# Patient Record
Sex: Female | Born: 1990 | Race: White | Hispanic: No | Marital: Single
Health system: Southern US, Community
[De-identification: ages and names within clinical notes are randomized; demographics above are authoritative.]

## PROBLEM LIST (undated history)

## (undated) DIAGNOSIS — J45909 Unspecified asthma, uncomplicated: Secondary | ICD-10-CM

## (undated) HISTORY — PX: INDUCED ABORTION: SHX677

---

## 2008-04-19 ENCOUNTER — Emergency Department (HOSPITAL_COMMUNITY): Admission: EM | Admit: 2008-04-19 | Discharge: 2008-04-20 | Payer: Self-pay | Admitting: Emergency Medicine

## 2013-02-28 ENCOUNTER — Emergency Department (HOSPITAL_COMMUNITY)
Admission: EM | Admit: 2013-02-28 | Discharge: 2013-03-02 | Disposition: A | Discharge status: Other Acute Inpatient Hospital | Payer: Self-pay | Attending: Emergency Medicine | Admitting: Emergency Medicine

## 2013-02-28 ENCOUNTER — Encounter (HOSPITAL_COMMUNITY): Payer: Self-pay | Admitting: Emergency Medicine

## 2013-02-28 DIAGNOSIS — R45851 Suicidal ideations: Secondary | ICD-10-CM | POA: Insufficient documentation

## 2013-02-28 DIAGNOSIS — J45909 Unspecified asthma, uncomplicated: Secondary | ICD-10-CM | POA: Insufficient documentation

## 2013-02-28 DIAGNOSIS — F1120 Opioid dependence, uncomplicated: Secondary | ICD-10-CM | POA: Insufficient documentation

## 2013-02-28 DIAGNOSIS — F1910 Other psychoactive substance abuse, uncomplicated: Secondary | ICD-10-CM | POA: Insufficient documentation

## 2013-02-28 HISTORY — DX: Unspecified asthma, uncomplicated: J45.909

## 2013-02-28 LAB — RAPID URINE DRUG SCREEN, HOSP PERFORMED
Amphetamines: NOT DETECTED
Benzodiazepines: POSITIVE — AB
Cocaine: POSITIVE — AB

## 2013-02-28 LAB — CBC WITH DIFFERENTIAL/PLATELET
Basophils Relative: 4 % — ABNORMAL HIGH (ref 0–1)
Eosinophils Absolute: 0.1 10*3/uL (ref 0.0–0.7)
HCT: 38 % (ref 36.0–46.0)
Hemoglobin: 12.5 g/dL (ref 12.0–15.0)
Lymphs Abs: 2.9 10*3/uL (ref 0.7–4.0)
MCH: 28.8 pg (ref 26.0–34.0)
MCHC: 32.9 g/dL (ref 30.0–36.0)
MCV: 87.6 fL (ref 78.0–100.0)
Monocytes Absolute: 0.6 10*3/uL (ref 0.1–1.0)
Neutro Abs: 1.9 10*3/uL (ref 1.7–7.7)

## 2013-02-28 LAB — COMPREHENSIVE METABOLIC PANEL
Alkaline Phosphatase: 78 U/L (ref 39–117)
BUN: 9 mg/dL (ref 6–23)
Calcium: 9.1 mg/dL (ref 8.4–10.5)
Creatinine, Ser: 0.9 mg/dL (ref 0.50–1.10)
GFR calc Af Amer: >90 mL/min (ref >90)
Glucose, Bld: 96 mg/dL (ref 70–99)
Potassium: 4.1 mEq/L (ref 3.5–5.1)
Total Protein: 6.9 g/dL (ref 6.0–8.3)

## 2013-02-28 LAB — URINALYSIS, ROUTINE W REFLEX MICROSCOPIC
Glucose, UA: NEGATIVE mg/dL
Leukocytes, UA: NEGATIVE
Nitrite: NEGATIVE
Specific Gravity, Urine: 1.033 — ABNORMAL HIGH (ref 1.005–1.030)
pH: 5.5 (ref 5.0–8.0)

## 2013-02-28 LAB — GLUCOSE, CAPILLARY: Glucose-Capillary: 109 mg/dL — ABNORMAL HIGH (ref 70–99)

## 2013-02-28 LAB — ACETAMINOPHEN LEVEL: Acetaminophen (Tylenol), Serum: <15 ug/mL (ref 10–30)

## 2013-02-28 MED ORDER — ONDANSETRON HCL 4 MG PO TABS
4.0000 mg | ORAL_TABLET | Freq: Three times a day (TID) | ORAL | Status: DC | PRN
  Administered 2013-03-01 (×3): 4 mg via ORAL
  Filled 2013-02-28 (×3): qty 1

## 2013-02-28 MED ORDER — LORAZEPAM 1 MG PO TABS
1.0000 mg | ORAL_TABLET | Freq: Three times a day (TID) | ORAL | Status: DC | PRN
  Administered 2013-03-01: 1 mg via ORAL
  Filled 2013-02-28: qty 2

## 2013-02-28 MED ORDER — NICOTINE 21 MG/24HR TD PT24
21.0000 mg | MEDICATED_PATCH | Freq: Every day | TRANSDERMAL | Status: DC
  Administered 2013-02-28 – 2013-03-01 (×2): 21 mg via TRANSDERMAL
  Filled 2013-02-28 (×2): qty 1

## 2013-02-28 MED ORDER — ACETAMINOPHEN 325 MG PO TABS
650.0000 mg | ORAL_TABLET | ORAL | Status: DC | PRN
  Administered 2013-03-01: 650 mg via ORAL
  Filled 2013-02-28: qty 2

## 2013-02-28 MED ORDER — ALUM & MAG HYDROXIDE-SIMETH 200-200-20 MG/5ML PO SUSP
30.0000 mL | ORAL | Status: DC | PRN
  Administered 2013-03-01: 30 mL via ORAL
  Filled 2013-02-28: qty 30

## 2013-02-28 MED ORDER — IBUPROFEN 200 MG PO TABS
600.0000 mg | ORAL_TABLET | Freq: Three times a day (TID) | ORAL | Status: DC | PRN
  Administered 2013-02-28 – 2013-03-01 (×3): 600 mg via ORAL
  Filled 2013-02-28: qty 2
  Filled 2013-02-28: qty 3
  Filled 2013-02-28: qty 1
  Filled 2013-02-28: qty 3

## 2013-02-28 MED ORDER — ZOLPIDEM TARTRATE 5 MG PO TABS
5.0000 mg | ORAL_TABLET | Freq: Every evening | ORAL | Status: DC | PRN
  Administered 2013-03-01 (×2): 5 mg via ORAL
  Filled 2013-02-28 (×2): qty 1
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
## 2013-02-28 NOTE — ED Notes (Signed)
Pt wanded by security. 

## 2013-02-28 NOTE — ED Provider Notes (Signed)
History    This chart was scribed for non-physician practitioner, Junious Silk PA-C, working with Laray Anger, DO by Donne Anon, ED Scribe. This patient was seen in room TR11C/TR11C and the patient's care was started at 1651.   CSN: [id number]  Arrival date & time 02/28/13  1554   First MD Initiated Contact with Patient 02/28/13 1651      Chief Complaint  Patient presents with  . Addiction Problem     The history is provided by the patient. No language interpreter was used.   HPI Comments: Kiara Stanley is a 22 y.o. female who presents to the Emergency Department desiring to detox from heroin, xanax and opiates. She reports she has been struggling with addiction for 7 years. She has tried to detox before with the last time being last week on her own. She has tried to detox as an outpatient once before but was unsuccesful. She reports she was in a facility in [place] years ago but she has since relapsed. She last used heroin this morning. She has attempted suicide once before by taking Seroquel and she reports she has thoughts of SI if she does not stop using opiates. She denies  HI or alcohol use. She denies difficulty breathing, fever, chills, nausea, vomiting or any other pain.    Past Medical History  Diagnosis Date  . Asthma     Past Surgical History  Procedure Laterality Date  . Induced abortion      No family history on file.  History  Substance Use Topics  . Smoking status: Current Every Day Smoker  . Smokeless tobacco: Not on file  . Alcohol Use: Not on file     Review of Systems  Constitutional: Negative for fever and chills.  Respiratory: Negative for shortness of breath.   Gastrointestinal: Negative for nausea and vomiting.  Psychiatric/Behavioral: Positive for suicidal ideas.  All other systems reviewed and are negative.    Allergies  Review of patient's allergies indicates no known allergies.  Home Medications  No current outpatient  prescriptions on file.  Triage Vitals; BP 105/76  Pulse 87  Temp(Src) 98.3 F (36.8 C) (Oral)  Resp 16  Ht 5\' 4"  (1.626 m)  Wt 125 lb (56.7 kg)  BMI 21.45 kg/m2  SpO2 96%  LMP 01/28/2013  Physical Exam  Nursing note and vitals reviewed. Constitutional: She is oriented to person, place, and time. She appears well-developed and well-nourished. No distress.  HENT:  Head: Normocephalic and atraumatic.  Right Ear: External ear normal.  Left Ear: External ear normal.  Nose: Nose normal.  Mouth/Throat: Oropharynx is clear and moist.  Eyes: Conjunctivae are normal. Right eye exhibits no discharge. Left eye exhibits no discharge. No scleral icterus.  Neck: Normal range of motion. Neck supple.  Cardiovascular: Normal rate, regular rhythm and normal heart sounds.   Pulmonary/Chest: Effort normal and breath sounds normal. No stridor. No respiratory distress. She has no wheezes. She has no rales.  Abdominal: Soft. She exhibits no distension.  Musculoskeletal: Normal range of motion.  Neurological: She is alert and oriented to person, place, and time. She has normal strength. Cranial nerve deficit:  no gross defecits noted.  Skin: Skin is warm and dry. She is not diaphoretic. No erythema.  Psychiatric: Her speech is slurred.    ED Course  Procedures (including critical care time) DIAGNOSTIC STUDIES: Oxygen Saturation is 96% on RA, adequate by my interpretation.    COORDINATION OF CARE: 6:14 PM Discussed treatment plan which  includes Telepsych with pt at bedside and pt agreed to plan.     Labs Reviewed  URINALYSIS, ROUTINE W REFLEX MICROSCOPIC  URINE RAPID DRUG SCREEN (HOSP PERFORMED)  ETHANOL  CBC WITH DIFFERENTIAL  COMPREHENSIVE METABOLIC PANEL  ACETAMINOPHEN LEVEL  SALICYLATE LEVEL  GLUCOSE, CAPILLARY  POCT PREGNANCY, URINE   No results found.   1. Opiate dependence   2. Suicidal ideation   3. Polysubstance abuse       MDM  Patient presents with wishes to detox  from heroin and xanax and suicidal ideations. Vital signs stable for transfer to [facility]. ACT team and telepsych consulted. Discussed case with Dr. Clarene Duke who agrees with plan.    I personally performed the services described in this documentation, which was scribed in my presence. The recorded information has been reviewed and is accurate.        Mora Bellman, PA-C 03/08/13 [phone]

## 2013-02-28 NOTE — ED Notes (Signed)
Pt st's she needs Detox from Heroin,  Xanax and opiates.   St's last used heroin this am.  Pt st's she has been trying to call places to get help but can't find anywhere to go.  Pt very tearful. St's she does not want to die.

## 2013-03-01 MED ORDER — DICYCLOMINE HCL 20 MG PO TABS
20.0000 mg | ORAL_TABLET | Freq: Four times a day (QID) | ORAL | Status: DC | PRN
  Administered 2013-03-01 (×3): 20 mg via ORAL
  Filled 2013-03-01 (×3): qty 1

## 2013-03-01 MED ORDER — NAPROXEN 250 MG PO TABS
500.0000 mg | ORAL_TABLET | Freq: Two times a day (BID) | ORAL | Status: DC | PRN
  Administered 2013-03-01: 500 mg via ORAL
  Filled 2013-03-01: qty 2

## 2013-03-01 MED ORDER — CLONIDINE HCL 0.1 MG PO TABS: 0.1000 mg | ORAL_TABLET | ORAL | Status: DC

## 2013-03-01 MED ORDER — METHOCARBAMOL 500 MG PO TABS
500.0000 mg | ORAL_TABLET | Freq: Three times a day (TID) | ORAL | Status: DC | PRN
  Administered 2013-03-01 (×3): 500 mg via ORAL
  Filled 2013-03-01 (×3): qty 1

## 2013-03-01 MED ORDER — ONDANSETRON 4 MG PO TBDP
4.0000 mg | ORAL_TABLET | Freq: Four times a day (QID) | ORAL | Status: DC | PRN
  Filled 2013-03-01: qty 1

## 2013-03-01 MED ORDER — LOPERAMIDE HCL 2 MG PO CAPS
2.0000 mg | ORAL_CAPSULE | ORAL | Status: DC | PRN
  Administered 2013-03-01: 2 mg via ORAL
  Filled 2013-03-01: qty 2

## 2013-03-01 MED ORDER — HYDROXYZINE HCL 25 MG PO TABS
25.0000 mg | ORAL_TABLET | Freq: Four times a day (QID) | ORAL | Status: DC | PRN
  Administered 2013-03-01 (×2): 25 mg via ORAL
  Filled 2013-03-01 (×2): qty 1

## 2013-03-01 MED ORDER — LORAZEPAM 1 MG PO TABS
2.0000 mg | ORAL_TABLET | ORAL | Status: DC | PRN
  Administered 2013-03-01: 2 mg via ORAL
  Administered 2013-03-01: 1 mg via ORAL
  Filled 2013-03-01: qty 2
  Filled 2013-03-01: qty 1

## 2013-03-01 MED ORDER — CLONIDINE HCL 0.1 MG PO TABS
0.1000 mg | ORAL_TABLET | Freq: Four times a day (QID) | ORAL | Status: DC
  Administered 2013-03-01 – 2013-03-02 (×4): 0.1 mg via ORAL
  Filled 2013-03-01 (×4): qty 1

## 2013-03-01 MED ORDER — CLONIDINE HCL 0.1 MG PO TABS: 0.1000 mg | ORAL_TABLET | Freq: Every day | ORAL | Status: DC

## 2013-03-01 MED ORDER — MORPHINE SULFATE ER 30 MG PO TBCR
60.0000 mg | EXTENDED_RELEASE_TABLET | Freq: Two times a day (BID) | ORAL | Status: DC
  Administered 2013-03-01: 60 mg via ORAL
  Filled 2013-03-01: qty 2

## 2013-03-01 MED ORDER — LORAZEPAM 1 MG PO TABS
1.0000 mg | ORAL_TABLET | Freq: Four times a day (QID) | ORAL | Status: DC | PRN
  Administered 2013-03-01 (×2): 1 mg via ORAL
  Filled 2013-03-01 (×2): qty 1

## 2013-03-01 NOTE — ED Provider Notes (Signed)
Pt resting comfortably, nad. Act eval/disposition remain pending.   Suzi Roots, MD 03/01/13 [phone]

## 2013-03-01 NOTE — ED Notes (Signed)
Patient's belongings inventoried and secured in ED storage. Patient also has jewelry and cash locked in ED security, envelope [id number].

## 2013-03-01 NOTE — ED Notes (Signed)
Patient has ambulated to the restroom. Gait steady. Linens changed on bed

## 2013-03-01 NOTE — BH Assessment (Signed)
[facility] Assessment Progress Note      Update:  [employer] and per [employer] [facility] [employer], beds available @ 1423.  Referral faxed for review.  Will continue bed finding for pt.

## 2013-03-01 NOTE — ED Notes (Signed)
Pt ambulated to BR without difficulty, given new set of scrubs to change into, calm and cooperative
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
## 2013-03-01 NOTE — BH Assessment (Signed)
[facility] Assessment Progress Note      Received a call from [place] at [facility]. Ms Halbleib has been accepted to their waiting list, but there are currently no appropriate beds available.  She will be able to come in the morning and Marcelino Duster will call back this evening with bed assignment.

## 2013-03-01 NOTE — ED Provider Notes (Signed)
Seen by Telepsych, Pt without SI/HI/psychosis, OK for detox programs, calm and cooperative now. 0700  Hurman Horn, MD 03/01/13 [phone]

## 2013-03-01 NOTE — ED Notes (Signed)
Patient states she uses 500-600 dollars of heroin a day. States started using opiates at age 22. States last detox was in 2012. Was only clean 1 day when got out of detox. States she wants off all drugs. Pt agreeable that the ms contin needs to be discontinued here if she wants to enter a detox facility. Will provide a safe quiet place for pt to begin her detox.

## 2013-03-01 NOTE — ED Notes (Signed)
Pt interviewed by tele psych doctor.
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
## 2013-03-01 NOTE — ED Provider Notes (Signed)
Medical screening examination/treatment/procedure(s) were conducted as a shared visit with non-physician practitioner(s) and myself.  I personally evaluated the patient during the encounter  21yo F, c/o heroin, opiate and xanax abuse for the past 7 years. LD heroin this morning. Endorses vague SI. No SA or HI. Requesting detox. A&O, resps easy, neuro non-focal. Labs and UDS obtained. ACT to see.    Laray Anger, DO 03/01/13 [phone]

## 2013-03-02 NOTE — ED Notes (Signed)
PT HAS ARRIVED TO TRANSPORT PT TO [facility]

## 2013-03-02 NOTE — BH Assessment (Signed)
[facility] Assessment Progress Note      Pt accepted to [facility] building by Dr Les Pou.  May be transported after 0700.  Unit secretary called [facility] to see if they would transfer.  Awaiting return call.

## 2013-03-02 NOTE — ED Notes (Signed)
PT HAS LEFT FOR [facility]. REPORT CALLED TO [facility]

## 2013-03-02 NOTE — ED Provider Notes (Signed)
BP 110/80  Pulse 48  Temp(Src) 97.4 F (36.3 C) (Oral)  Resp 17  Ht 5\' 4"  (1.626 m)  Wt 125 lb (56.7 kg)  BMI 21.45 kg/m2  SpO2 97%  LMP 01/28/2013 No acute issues overnight. Resting comfortably. Pending placement.   Loren Racer, MD 03/02/13 [phone]

## 2013-03-10 NOTE — ED Provider Notes (Signed)
Medical screening examination/treatment/procedure(s) were conducted as a shared visit with non-physician practitioner(s) and myself.  I personally evaluated the patient during the encounter. Please see my previous note.  Laray Anger, DO 03/10/13 [phone]

## 2013-03-26 ENCOUNTER — Emergency Department (HOSPITAL_COMMUNITY)
Admission: EM | Admit: 2013-03-26 | Discharge: 2013-03-26 | Disposition: A | Discharge status: Home / Self Care | Payer: Self-pay | Attending: Emergency Medicine | Admitting: Emergency Medicine

## 2013-03-26 ENCOUNTER — Emergency Department (HOSPITAL_COMMUNITY): Payer: Self-pay

## 2013-03-26 ENCOUNTER — Encounter (HOSPITAL_COMMUNITY): Payer: Self-pay | Admitting: Emergency Medicine

## 2013-03-26 DIAGNOSIS — IMO0002 Reserved for concepts with insufficient information to code with codable children: Secondary | ICD-10-CM | POA: Insufficient documentation

## 2013-03-26 DIAGNOSIS — Y939 Activity, unspecified: Secondary | ICD-10-CM | POA: Insufficient documentation

## 2013-03-26 DIAGNOSIS — F17200 Nicotine dependence, unspecified, uncomplicated: Secondary | ICD-10-CM | POA: Insufficient documentation

## 2013-03-26 DIAGNOSIS — Y92410 Unspecified street and highway as the place of occurrence of the external cause: Secondary | ICD-10-CM | POA: Insufficient documentation

## 2013-03-26 DIAGNOSIS — S50811A Abrasion of right forearm, initial encounter: Secondary | ICD-10-CM

## 2013-03-26 DIAGNOSIS — J45909 Unspecified asthma, uncomplicated: Secondary | ICD-10-CM | POA: Insufficient documentation

## 2013-03-26 DIAGNOSIS — Z791 Long term (current) use of non-steroidal anti-inflammatories (NSAID): Secondary | ICD-10-CM | POA: Insufficient documentation

## 2013-03-26 LAB — POCT I-STAT, CHEM 8
Creatinine, Ser: 0.9 mg/dL (ref 0.50–1.10)
Glucose, Bld: 90 mg/dL (ref 70–99)
Hemoglobin: 12.6 g/dL (ref 12.0–15.0)
Sodium: 137 mEq/L (ref 135–145)
TCO2: 31 mmol/L (ref 0–100)

## 2013-03-26 LAB — ETHANOL: Alcohol, Ethyl (B): <11 mg/dL (ref 0–11)

## 2013-03-26 MED ORDER — NAPROXEN 375 MG PO TABS: 375.0000 mg | ORAL_TABLET | Freq: Two times a day (BID) | ORAL | Status: AC

## 2013-03-26 NOTE — ED Notes (Signed)
Mother on the way to pick up pt.
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
## 2013-03-26 NOTE — ED Notes (Signed)
April Lacap (mother) [phone]
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
## 2013-03-26 NOTE — ED Notes (Signed)
Pt not responding, garbled words, drifts in and out of sleep. Pt placed on 2L of O2 sats at 87%. Currently 100%.
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
## 2014-03-01 ENCOUNTER — Ambulatory Visit: Payer: Self-pay | Admitting: Physician Assistant

## 2014-03-01 DIAGNOSIS — Z0289 Encounter for other administrative examinations: Secondary | ICD-10-CM

## 2014-03-11 ENCOUNTER — Telehealth: Payer: Self-pay | Admitting: Physician Assistant

## 2014-03-11 ENCOUNTER — Ambulatory Visit: Payer: Self-pay | Admitting: Physician Assistant

## 2014-03-11 DIAGNOSIS — Z0289 Encounter for other administrative examinations: Secondary | ICD-10-CM

## 2014-03-11 NOTE — Telephone Encounter (Signed)
Tried to contact patient, voicemail was full.  Per, Kiara Stanley, patient has no showed and cancelled several times. Patient cannot schedule [facility] at [facility].

## 2015-06-12 ENCOUNTER — Inpatient Hospital Stay (HOSPITAL_COMMUNITY)
Admission: EM | Admit: 2015-06-12 | Discharge: 2015-06-28 | DRG: 781 | Disposition: E | Payer: Medicaid Other | Attending: Emergency Medicine | Admitting: Emergency Medicine

## 2015-06-12 ENCOUNTER — Inpatient Hospital Stay (HOSPITAL_COMMUNITY): Payer: Medicaid Other

## 2015-06-12 ENCOUNTER — Emergency Department (HOSPITAL_COMMUNITY): Payer: Medicaid Other

## 2015-06-12 ENCOUNTER — Encounter (HOSPITAL_COMMUNITY): Payer: Self-pay | Admitting: Neurology

## 2015-06-12 DIAGNOSIS — Z3A08 8 weeks gestation of pregnancy: Secondary | ICD-10-CM | POA: Diagnosis present

## 2015-06-12 DIAGNOSIS — Z66 Do not resuscitate: Secondary | ICD-10-CM | POA: Diagnosis not present

## 2015-06-12 DIAGNOSIS — G931 Anoxic brain damage, not elsewhere classified: Secondary | ICD-10-CM | POA: Diagnosis present

## 2015-06-12 DIAGNOSIS — G936 Cerebral edema: Secondary | ICD-10-CM | POA: Diagnosis present

## 2015-06-12 DIAGNOSIS — A419 Sepsis, unspecified organism: Secondary | ICD-10-CM | POA: Diagnosis present

## 2015-06-12 DIAGNOSIS — O99411 Diseases of the circulatory system complicating pregnancy, first trimester: Secondary | ICD-10-CM | POA: Diagnosis present

## 2015-06-12 DIAGNOSIS — G9340 Encephalopathy, unspecified: Secondary | ICD-10-CM | POA: Diagnosis present

## 2015-06-12 DIAGNOSIS — J690 Pneumonitis due to inhalation of food and vomit: Secondary | ICD-10-CM | POA: Diagnosis present

## 2015-06-12 DIAGNOSIS — R570 Cardiogenic shock: Secondary | ICD-10-CM | POA: Diagnosis present

## 2015-06-12 DIAGNOSIS — R4182 Altered mental status, unspecified: Secondary | ICD-10-CM

## 2015-06-12 DIAGNOSIS — R739 Hyperglycemia, unspecified: Secondary | ICD-10-CM | POA: Diagnosis present

## 2015-06-12 DIAGNOSIS — R6521 Severe sepsis with septic shock: Secondary | ICD-10-CM | POA: Diagnosis present

## 2015-06-12 DIAGNOSIS — T401X4A Poisoning by heroin, undetermined, initial encounter: Secondary | ICD-10-CM | POA: Diagnosis present

## 2015-06-12 DIAGNOSIS — G935 Compression of brain: Secondary | ICD-10-CM | POA: Diagnosis present

## 2015-06-12 DIAGNOSIS — O9A211 Injury, poisoning and certain other consequences of external causes complicating pregnancy, first trimester: Principal | ICD-10-CM | POA: Diagnosis present

## 2015-06-12 DIAGNOSIS — K7200 Acute and subacute hepatic failure without coma: Secondary | ICD-10-CM | POA: Diagnosis present

## 2015-06-12 DIAGNOSIS — O2690 Pregnancy related conditions, unspecified, unspecified trimester: Secondary | ICD-10-CM

## 2015-06-12 DIAGNOSIS — E875 Hyperkalemia: Secondary | ICD-10-CM | POA: Diagnosis present

## 2015-06-12 DIAGNOSIS — N179 Acute kidney failure, unspecified: Secondary | ICD-10-CM | POA: Diagnosis present

## 2015-06-12 DIAGNOSIS — I469 Cardiac arrest, cause unspecified: Secondary | ICD-10-CM | POA: Diagnosis present

## 2015-06-12 DIAGNOSIS — J9601 Acute respiratory failure with hypoxia: Secondary | ICD-10-CM | POA: Diagnosis present

## 2015-06-12 DIAGNOSIS — O98811 Other maternal infectious and parasitic diseases complicating pregnancy, first trimester: Secondary | ICD-10-CM | POA: Diagnosis present

## 2015-06-12 DIAGNOSIS — O99011 Anemia complicating pregnancy, first trimester: Secondary | ICD-10-CM | POA: Diagnosis present

## 2015-06-12 DIAGNOSIS — E872 Acidosis: Secondary | ICD-10-CM | POA: Diagnosis present

## 2015-06-12 DIAGNOSIS — Y92009 Unspecified place in unspecified non-institutional (private) residence as the place of occurrence of the external cause: Secondary | ICD-10-CM

## 2015-06-12 DIAGNOSIS — N170 Acute kidney failure with tubular necrosis: Secondary | ICD-10-CM | POA: Diagnosis present

## 2015-06-12 DIAGNOSIS — F1110 Opioid abuse, uncomplicated: Secondary | ICD-10-CM | POA: Diagnosis present

## 2015-06-12 LAB — RAPID URINE DRUG SCREEN, HOSP PERFORMED
Amphetamines: NOT DETECTED
BARBITURATES: NOT DETECTED
Benzodiazepines: POSITIVE — AB
COCAINE: NOT DETECTED
Opiates: POSITIVE — AB
Tetrahydrocannabinol: NOT DETECTED

## 2015-06-12 LAB — URINALYSIS, ROUTINE W REFLEX MICROSCOPIC
BILIRUBIN URINE: NEGATIVE
Glucose, UA: NEGATIVE mg/dL
HGB URINE DIPSTICK: NEGATIVE
KETONES UR: NEGATIVE mg/dL
Leukocytes, UA: NEGATIVE
Nitrite: NEGATIVE
PROTEIN: NEGATIVE mg/dL
Specific Gravity, Urine: 1.021 (ref 1.005–1.030)
UROBILINOGEN UA: 1 mg/dL (ref 0.0–1.0)
pH: 6 (ref 5.0–8.0)

## 2015-06-12 LAB — I-STAT CHEM 8, ED
BUN: 12 mg/dL (ref 6–20)
BUN: 11 mg/dL (ref 6–20)
CALCIUM ION: 1.11 mmol/L — AB (ref 1.12–1.23)
CHLORIDE: 105 mmol/L (ref 101–111)
CHLORIDE: 105 mmol/L (ref 101–111)
CREATININE: 1.1 mg/dL — AB (ref 0.44–1.00)
Calcium, Ion: 1.14 mmol/L (ref 1.12–1.23)
Creatinine, Ser: 1.2 mg/dL — ABNORMAL HIGH (ref 0.44–1.00)
GLUCOSE: 271 mg/dL — AB (ref 65–99)
Glucose, Bld: 228 mg/dL — ABNORMAL HIGH (ref 65–99)
HCT: 40 % (ref 36.0–46.0)
HEMATOCRIT: 35 % — AB (ref 36.0–46.0)
Hemoglobin: 13.6 g/dL (ref 12.0–15.0)
Hemoglobin: 11.9 g/dL — ABNORMAL LOW (ref 12.0–15.0)
POTASSIUM: 5.9 mmol/L — AB (ref 3.5–5.1)
POTASSIUM: 4.8 mmol/L (ref 3.5–5.1)
SODIUM: 138 mmol/L (ref 135–145)
Sodium: 138 mmol/L (ref 135–145)
TCO2: 14 mmol/L (ref 0–100)
TCO2: 15 mmol/L (ref 0–100)

## 2015-06-12 LAB — SALICYLATE LEVEL: SALICYLATE LVL: 5.1 mg/dL (ref 2.8–30.0)

## 2015-06-12 LAB — I-STAT TROPONIN, ED: Troponin i, poc: 0.19 ng/mL (ref 0.00–0.08)

## 2015-06-12 LAB — CBC WITH DIFFERENTIAL/PLATELET
BASOS ABS: 0 10*3/uL (ref 0.0–0.1)
Basophils Relative: 0 %
EOS ABS: 0.1 10*3/uL (ref 0.0–0.7)
Eosinophils Relative: 1 %
HCT: 33.2 % — ABNORMAL LOW (ref 36.0–46.0)
Hemoglobin: 10.3 g/dL — ABNORMAL LOW (ref 12.0–15.0)
LYMPHS PCT: 73 %
Lymphs Abs: 7.3 10*3/uL — ABNORMAL HIGH (ref 0.7–4.0)
MCH: 30.1 pg (ref 26.0–34.0)
MCHC: 31 g/dL (ref 30.0–36.0)
MCV: 97.1 fL (ref 78.0–100.0)
Monocytes Absolute: 0.4 10*3/uL (ref 0.1–1.0)
Monocytes Relative: 4 %
NEUTROS PCT: 22 %
Neutro Abs: 2.2 10*3/uL (ref 1.7–7.7)
PLATELETS: 125 10*3/uL — AB (ref 150–400)
RBC: 3.42 MIL/uL — ABNORMAL LOW (ref 3.87–5.11)
RDW: 13.6 % (ref 11.5–15.5)
WBC: 10 10*3/uL (ref 4.0–10.5)

## 2015-06-12 LAB — I-STAT ARTERIAL BLOOD GAS, ED
ACID-BASE DEFICIT: 22 mmol/L — AB (ref 0.0–2.0)
Bicarbonate: 9.9 mEq/L — ABNORMAL LOW (ref 20.0–24.0)
O2 SAT: 88 %
PCO2 ART: 46.6 mmHg — AB (ref 35.0–45.0)
PH ART: 6.936 — AB (ref 7.350–7.450)
Patient temperature: 98.6
TCO2: 11 mmol/L (ref 0–100)
pO2, Arterial: 87 mmHg (ref 80.0–100.0)

## 2015-06-12 LAB — COMPREHENSIVE METABOLIC PANEL
ALT: 793 U/L — AB (ref 14–54)
AST: 874 U/L — AB (ref 15–41)
Albumin: 2.1 g/dL — ABNORMAL LOW (ref 3.5–5.0)
Alkaline Phosphatase: 68 U/L (ref 38–126)
Anion gap: 22 — ABNORMAL HIGH (ref 5–15)
BUN: 7 mg/dL (ref 6–20)
CHLORIDE: 111 mmol/L (ref 101–111)
CO2: 9 mmol/L — AB (ref 22–32)
CREATININE: 1.18 mg/dL — AB (ref 0.44–1.00)
Calcium: 7.6 mg/dL — ABNORMAL LOW (ref 8.9–10.3)
GFR calc non Af Amer: >60 mL/min (ref >60)
Glucose, Bld: 241 mg/dL — ABNORMAL HIGH (ref 65–99)
Potassium: 3.9 mmol/L (ref 3.5–5.1)
SODIUM: 142 mmol/L (ref 135–145)
Total Bilirubin: 0.3 mg/dL (ref 0.3–1.2)
Total Protein: 4.1 g/dL — ABNORMAL LOW (ref 6.5–8.1)

## 2015-06-12 LAB — I-STAT BETA HCG BLOOD, ED (MC, WL, AP ONLY)

## 2015-06-12 LAB — TROPONIN I: TROPONIN I: 0.3 ng/mL — AB (ref <0.031)

## 2015-06-12 LAB — PREGNANCY, URINE: PREG TEST UR: POSITIVE — AB

## 2015-06-12 LAB — MRSA PCR SCREENING: MRSA BY PCR: NEGATIVE

## 2015-06-12 LAB — ETHANOL: Alcohol, Ethyl (B): 6 mg/dL — ABNORMAL HIGH (ref <5)

## 2015-06-12 LAB — PROTIME-INR
INR: 1.77 — ABNORMAL HIGH (ref 0.00–1.49)
PROTHROMBIN TIME: 20.6 s — AB (ref 11.6–15.2)

## 2015-06-12 LAB — ACETAMINOPHEN LEVEL

## 2015-06-12 LAB — I-STAT CG4 LACTIC ACID, ED: Lactic Acid, Venous: 16.89 mmol/L (ref 0.5–2.0)

## 2015-06-12 LAB — LACTIC ACID, PLASMA: LACTIC ACID, VENOUS: 5.7 mmol/L — AB (ref 0.5–2.0)

## 2015-06-12 LAB — HCG, QUANTITATIVE, PREGNANCY: hCG, Beta Chain, Quant, S: 80852 m[IU]/mL — ABNORMAL HIGH (ref <5)

## 2015-06-12 LAB — APTT: aPTT: 109 seconds — ABNORMAL HIGH (ref 24–37)

## 2015-06-12 MED ORDER — SODIUM BICARBONATE 8.4 % IV SOLN
50.0000 meq | Freq: Once | INTRAVENOUS | Status: AC
  Administered 2015-06-12: 50 meq via INTRAVENOUS
  Filled 2015-06-12: qty 50

## 2015-06-12 MED ORDER — EPINEPHRINE HCL 0.1 MG/ML IJ SOSY
PREFILLED_SYRINGE | INTRAMUSCULAR | Status: AC | PRN
  Administered 2015-06-12 (×3): 1 mg via INTRAVENOUS

## 2015-06-12 MED ORDER — PANTOPRAZOLE SODIUM 40 MG IV SOLR: 40.0000 mg | Freq: Every day | INTRAVENOUS | Status: DC

## 2015-06-12 MED ORDER — MORPHINE SULFATE (PF) 2 MG/ML IV SOLN: 2.0000 mg | INTRAVENOUS | Status: DC | PRN

## 2015-06-12 MED ORDER — NOREPINEPHRINE BITARTRATE 1 MG/ML IV SOLN
0.0000 ug/min | Freq: Once | INTRAVENOUS | Status: AC
  Administered 2015-06-12: 30 ug/min via INTRAVENOUS

## 2015-06-12 MED ORDER — SODIUM BICARBONATE 8.4 % IV SOLN
INTRAVENOUS | Status: AC | PRN
  Administered 2015-06-12: 50 meq via INTRAVENOUS

## 2015-06-12 MED ORDER — SODIUM CHLORIDE 0.9 % IV SOLN: 250.0000 mL | INTRAVENOUS | Status: DC | PRN

## 2015-06-12 MED ORDER — NOREPINEPHRINE BITARTRATE 1 MG/ML IV SOLN
0.0000 ug/min | INTRAVENOUS | Status: DC
  Filled 2015-06-12: qty 4

## 2015-06-12 MED ORDER — SODIUM CHLORIDE 0.9 % IV SOLN
INTRAVENOUS | Status: DC
  Administered 2015-06-12: 16:00:00 via INTRAVENOUS

## 2015-06-12 MED ORDER — NOREPINEPHRINE BITARTRATE 1 MG/ML IV SOLN
0.0000 ug/min | INTRAVENOUS | Status: DC
  Administered 2015-06-12 (×2): 50 ug/min via INTRAVENOUS
  Filled 2015-06-12 (×3): qty 16

## 2015-06-12 MED ORDER — PHENYLEPHRINE HCL 10 MG/ML IJ SOLN
30.0000 ug/min | INTRAVENOUS | Status: DC
  Administered 2015-06-12: 30 ug/min via INTRAVENOUS
  Filled 2015-06-12: qty 1

## 2015-06-12 MED ORDER — SODIUM CHLORIDE 0.9 % IV SOLN
3.0000 g | Freq: Four times a day (QID) | INTRAVENOUS | Status: DC
  Administered 2015-06-12: 3 g via INTRAVENOUS
  Filled 2015-06-12 (×6): qty 3

## 2015-06-12 MED ORDER — ALBUTEROL SULFATE (2.5 MG/3ML) 0.083% IN NEBU
2.5000 mg | INHALATION_SOLUTION | RESPIRATORY_TRACT | Status: DC | PRN
Start: 2015-06-12 — End: 2015-06-13

## 2015-06-12 MED ORDER — SODIUM BICARBONATE 4.2 % IV SOLN
50.0000 meq | Freq: Once | INTRAVENOUS | Status: DC
  Filled 2015-06-12: qty 100

## 2015-06-12 MED ORDER — ENOXAPARIN SODIUM 30 MG/0.3ML ~~LOC~~ SOLN: 30.0000 mg | SUBCUTANEOUS | Status: DC

## 2015-06-12 MED ORDER — PHENYLEPHRINE HCL 10 MG/ML IJ SOLN
30.0000 ug/min | INTRAMUSCULAR | Status: DC
  Administered 2015-06-12: 175 ug/min via INTRAVENOUS
  Administered 2015-06-12 – 2015-06-13 (×3): 200 ug/min via INTRAVENOUS
  Filled 2015-06-12 (×4): qty 4

## 2015-06-12 MED ORDER — INSULIN ASPART 100 UNIT/ML ~~LOC~~ SOLN: 0.0000 [IU] | SUBCUTANEOUS | Status: DC

## 2015-06-12 MED ORDER — SODIUM CHLORIDE 0.9 % IV BOLUS (SEPSIS)
1000.0000 mL | Freq: Once | INTRAVENOUS | Status: AC
  Administered 2015-06-12: 1000 mL via INTRAVENOUS

## 2015-06-12 NOTE — Progress Notes (Signed)
Chaplain present in ED when pt arrived CPR in progress.  Chaplain later paged when family arrived.  Chaplain escorted family to consult room and facilitated update from MD regarding pt's status and plan of care.  Chaplain provided emotional support through hospitality, presence and empathetic listening.  Family escorted to [facility] waiting and staff notified.  Chaplain handed off to on-call chaplain who was escorting family (pt's mother and fiance) to bedside.    June 23, 2015 1700  Clinical Encounter Type  Visited With Family;Patient not available  Visit Type Initial;Social support;Critical Care;ED  Spiritual Encounters  Spiritual Needs Emotional;Grief support  Stress Factors  Family Stress Factors Exhausted;Family relationships;Health changes;Loss;Major life changes   Kiara Stanley 2015/06/23 5:15 PM
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
## 2015-06-12 NOTE — Progress Notes (Signed)
Updated family (mother, fiance) at length regarding pts status, poor prognosis.  All questions answered.  They want to continue current care but ensure her comfort and would not wish to perform additional CPR, cardioversion in the event of repeat arrest.  Will change to DNR, add intermittent PRN morphine.  Continue other current measures.  Some labs, studies still pending.    Dirk Dress, NP 06/17/2015  4:57 PM

## 2015-06-12 NOTE — ED Provider Notes (Addendum)
CSN: [id number]     Arrival date & time 07/12/15  1430 History   First MD Initiated Contact with Patient 12-Jul-2015 1504     Chief Complaint  Patient presents with  . Cardiac Arrest   Level V caveat cardiac arrest HPI Patient presented to the emergency room in cardiac arrest. Patient was last seen normal approximately noon. She went to take a nap with her boyfriend. Patient was found unresponsive by the boyfriend's mother. They were unable to wake her up. This was approximately at 1:30 PM. EMS was contacted and they initiated CPR. Patient was given 2 mg of Narcan 9 epinephrine with return of spontaneous circulation.  During transport, they lost pulses and resumed CPR. Capnography remained greater than 10. Patient was noted to BPH on arrival. She had an interosseous line placed in the anterior tibia. Patient also was intubated by EMS.  EMS states that family told them that the patient is [redacted] weeks pregnant. They also told EMS she has a history of narcotic abuse but none recently. She did take Xanax today. History reviewed. No pertinent past medical history. History reviewed. No pertinent past surgical history. No family history on file. Social History  Substance Use Topics  . Smoking status: None  . Smokeless tobacco: None  . Alcohol Use: None   OB History    No data available     Review of Systems  Unable to perform ROS: Mental status change      Allergies  Review of patient's allergies indicates not on file.  Home Medications   Prior to Admission medications   Not on File   BP 76/33 mmHg  Pulse 97  Resp 23  SpO2 93%  LMP  (LMP Unknown) Physical Exam  Constitutional: She appears well-developed and well-nourished.  HENT:  Head: Normocephalic and atraumatic.  Right Ear: External ear normal.  Left Ear: External ear normal.  Endotracheal tube in the oropharynx  Eyes: Conjunctivae are normal. Right eye exhibits no discharge. Left eye exhibits no discharge. No scleral icterus.   Pupils are nonreactive  Neck: Neck supple. No tracheal deviation present.  Cardiovascular:  Absent heart sounds  Pulmonary/Chest: Effort normal and breath sounds normal. No stridor. No respiratory distress. She has no wheezes. She has no rales.  Equal breath sounds bilaterally, bag valve endotracheal tube ventilation  Abdominal: Soft. Bowel sounds are normal. She exhibits no distension. There is no tenderness. There is no rebound and no guarding.  Musculoskeletal: She exhibits no edema.  No deformities or signs of trauma, IV track marks noted in the upper extremities  Neurological: She has normal strength. No sensory deficit. She displays no seizure activity. GCS eye subscore is 1. GCS verbal subscore is 1. GCS motor subscore is 1.  No response to painful or verbal stimuli  Skin: Skin is warm and dry. No rash noted. She is not diaphoretic.  Psychiatric: She has a normal mood and affect.  Nursing note and vitals reviewed.   ED Course  CRITICAL CARE Performed by: Linwood Dibbles Authorized by: Linwood Dibbles Critical care was necessary to treat or prevent imminent or life-threatening deterioration of the following conditions: cardiac failure. Critical care was time spent personally by me on the following activities: blood draw for specimens, discussions with consultants, evaluation of patient's response to treatment, examination of patient, ordering and performing treatments and interventions and re-evaluation of patient's condition.  CENTRAL LINE Date/Time: 2015-07-12 3:25 PM Performed by: Linwood Dibbles Authorized by: Linwood Dibbles Consent: The procedure was performed in  an emergent situation. Indications: vascular access Patient sedated: no Preparation: skin prepped with 2% chlorhexidine Skin prep agent dried: skin prep agent completely dried prior to procedure Location details: right femoral Site selection rationale: rapid access, code situation Catheter type: Cordis Ultrasound guidance:  yes Sterile ultrasound techniques: sterile gel and sterile probe covers were used Number of attempts: 1 Successful placement: yes Post-procedure: line sutured Assessment: blood return through all ports Patient tolerance: Patient tolerated the procedure well with no immediate complications   (including critical care time) Labs Review Labs Reviewed  I-STAT CHEM 8, ED - Abnormal; Notable for the following:    Potassium 5.9 (*)    Creatinine, Ser 1.10 (*)    Glucose, Bld 271 (*)    All other components within normal limits  I-STAT CG4 LACTIC ACID, ED - Abnormal; Notable for the following:    Lactic Acid, Venous 16.89 (*)    All other components within normal limits  I-STAT TROPOININ, ED - Abnormal; Notable for the following:    Troponin i, poc 0.19 (*)    All other components within normal limits  CBC WITH DIFFERENTIAL/PLATELET  COMPREHENSIVE METABOLIC PANEL  APTT  PROTIME-INR  TROPONIN I  BLOOD GAS, ARTERIAL  ETHANOL  URINE RAPID DRUG SCREEN, HOSP PERFORMED  SALICYLATE LEVEL  ACETAMINOPHEN LEVEL  URINALYSIS, ROUTINE W REFLEX MICROSCOPIC (NOT AT [facility])  PREGNANCY, URINE  I-STAT BETA HCG BLOOD, ED ([facility], [facility], [facility] ONLY)    Imaging Review No results found. I have personally reviewed and evaluated these images and lab results as part of my medical decision-making.   EKG Interpretation   Date/Time:  Thursday 2015/07/04 14:36:53 EDT Ventricular Rate:  125 PR Interval:  383 QRS Duration: 115 QT Interval:  327 QTC Calculation: 471 R Axis:   -118 Text Interpretation:  Accelerated junctional rhythm Prolonged PR interval  RBBB and LAFB Low voltage, precordial leads ST depression, consider  ischemia, diffuse lds Borderline ST elevation, anterior leads No old  tracing to compare Confirmed by Colletta Spillers  MD-J, Kiva Norland ([id number]) on 07/04/2015  3:27:54 PM      MDM   Final diagnoses:  Cardiac arrest    We continued cardiopulmonary resuscitation in the emergency department. On  bedside ultrasound i noted good cardiac contractility.  We checked pulses and patient had palpable pulses.  I placed a right femoral line for rapid IV access as we only had an IO line.  Nurses were unable to get peripheral IV access.  Pt has maintained a pulse while on pressors.  Very poor prognosis with her downtime.  Started cold saline.  Critical care will admit.   Linwood Dibbles, MD 2015-07-04 1534  Late addition.  Critical care time approx 40 minutes  Linwood Dibbles, MD 07/03/15 [phone]

## 2015-06-12 NOTE — ED Notes (Signed)
Dr. Lynelle Doctor at beside placing femoral central line.

## 2015-06-12 NOTE — H&P (Signed)
PULMONARY / CRITICAL CARE MEDICINE   Name: Kiara Stanley MRN: [id number] DOB: September 19, 1991    ADMISSION DATE:  06-26-2015  REFERRING MD :  EDP  CHIEF COMPLAINT:  Post arrest   INITIAL PRESENTATION:  23yo female with hx heroin abuse, [redacted] weeks pregnant per family presented 9/15 as asystolic/PEA arrest with total 50 mins CPR before ROSC.  PCCM called to admit.   STUDIES:  Echo 9/15>>> CT head 9/16>>>  SIGNIFICANT EVENTS:  HISTORY OF PRESENT ILLNESS:  23yo female with hx heroin abuse, [redacted] weeks pregnant presented 9/15 after being found unresponsive by boyfriend.  Last seen normal at noon when she went into the bathroom then laid down to "nap" . Per boyfriend she did not use heroin today, but he did see her take xanax.  EMS called at 1340.  Narcan given without response.  On EMS arrival pt asystole, then PEA.  Intermittent ROSC but total 50 mins CPR before persistent ROSC.  PCCM called to admit.    PAST MEDICAL HISTORY :   has no past medical history on file.  has no past surgical history on file. Prior to Admission medications   Not on File  Substance Abuse, heroin   Not on File  FAMILY HISTORY:  has no family status information on file.  SOCIAL HISTORY:    REVIEW OF SYSTEMS:  Unable.   SUBJECTIVE:  Cannot obtain  VITAL SIGNS: Pulse Rate:  [97-118] 108 (09/15 1540) Resp:  [10-23] 21 (09/15 1540) BP: (62-76)/(25-42) 75/35 mmHg (09/15 1540) SpO2:  [90 %-97 %] 93 % (09/15 1540) HEMODYNAMICS:   VENTILATOR SETTINGS:   INTAKE / OUTPUT:  Intake/Output Summary (Last 24 hours) at 06-26-15 1553 Last data filed at Jun 26, 2015 1517  Gross per 24 hour  Intake   2000 ml  Output      0 ml  Net   2000 ml    PHYSICAL EXAMINATION: General:  Young female, unresponsive post arrest  Neuro:  Unresponsive, no sedation, pupils 6mm fixed HEENT:  Mm moist, no JVD, ETT Cardiovascular:  S1s2, wide complex tachy  Lungs:  resps even non labored on vent, coarse R>L  Abdomen:  Soft, -bs   Musculoskeletal:  Warm and dry, no edema, track marks R>L arms  LABS:  CBC  Recent Labs Lab 26-Jun-2015 1446 06-26-2015 1453 2015/06/26 1550  WBC  --  PENDING  --   HGB 13.6 10.3* 11.9*  HCT 40.0 33.2* 35.0*  PLT  --  PENDING  --    Coag's No results for input(s): APTT, INR in the last 168 hours. BMET  Recent Labs Lab Jun 26, 2015 1446 Jun 26, 2015 1453 2015/06/26 1550  NA 138 142 138  K 5.9* 3.9 4.8  CL 105 111 105  CO2  --  9*  --   BUN 12 7 11   CREATININE 1.10* 1.18* 1.20*  GLUCOSE 271* 241* 228*   Electrolytes  Recent Labs Lab 06/26/15 1453  CALCIUM 7.6*   Sepsis Markers  Recent Labs Lab June 26, 2015 1446  LATICACIDVEN 16.89*   ABG No results for input(s): PHART, PCO2ART, PO2ART in the last 168 hours. Liver Enzymes  Recent Labs Lab 2015-06-26 1453  AST 874*  ALT 793*  ALKPHOS 68  BILITOT 0.3  ALBUMIN 2.1*   Cardiac Enzymes  Recent Labs Lab 06/26/2015 1453  TROPONINI 0.30*   Glucose No results for input(s): GLUCAP in the last 168 hours.  Imaging Dg Chest Portable 1 View  Jun 26, 2015   CLINICAL DATA:  Hypoxia  EXAM: PORTABLE CHEST - 1 VIEW  COMPARISON:  Study obtained earlier in the day  FINDINGS: Endotracheal tube tip remains in the proximal left main bronchus. Nasogastric tube tip and side port are in the stomach. There is persistent consolidation throughout the right upper lobe. Lungs elsewhere clear. Heart size and pulmonary vascularity are normal. No pneumothorax. No adenopathy.  IMPRESSION: Endotracheal tube tip remains in the proximal left main bronchus. Advise withdrawing tube approximately 5 cm. No pneumothorax. Consolidation right upper lobe. Lungs otherwise clear.  Critical Value/emergent results were called by telephone at the time of interpretation on 2015/07/07 at 3:31 pm to Dr. Linwood Dibbles , who verbally acknowledged these results.   Electronically Signed   By: Bretta Bang III M.D.   On: 07/07/15 15:32   Dg Chest Portable 1 View  07/07/2015    CLINICAL DATA:  Overdose. Cardiac arrest. Early pregnancy. Endotracheal tube placement. Femoral line insertion.  EXAM: PORTABLE CHEST - 1 VIEW  COMPARISON:  None  FINDINGS: Endotracheal tube tip is just into the left mainstem bronchus. Retraction of 3 cm is recommended.  Extensive airspace opacity in the right lung especially the right upper lobe, possible involvement of the right middle lobe and/ or right lower lobe.  Mild enlargement of the cardiopericardial silhouette.  IMPRESSION: 1. Endotracheal tube malposition, tip in the left mainstem bronchus. 3 cm retraction recommended. I note that the patient had a later chest radiograph, but even on that chest radiograph the ET tube is still to low and needs to come back 3 cm. 2. Extensive airspace opacity, right upper lobe and possibly involving the middle lobe and/or lower lobe. Multilobar pneumonia not excluded. This could also be aspiration pneumonitis or asymmetric edema in the setting of the patient's recent cardiac arrest. 3. Mild enlargement of the cardiopericardial silhouette Critical Value/emergent results were called by telephone at the time of interpretation on Jul 07, 2015 at 3:28 pm to Dr. Linwood Dibbles , who verbally acknowledged these results.   Electronically Signed   By: Gaylyn Rong M.D.   On: 07/07/15 15:31     ASSESSMENT / PLAN:  PULMONARY OETT 9/15>>> Acute respiratory failure - post cardiac arrest  Suspect aspiration PNA  P:   Vent support - 8cc/kg  F/u CXR  Stat ABG Pull ETT back 2cm  Empiric abx as below   CARDIOVASCULAR CVL R fem CVL (ER) 9/15>>> Asystolic/PEA arrest - presume OD but etiology remains unclear  Cardiogenic shock  P:  Will NOT cool based on prolonged downtime, persistent shock, original rhythm asystole Echo, ekg, troponin, CK  F/u lactate  Titrate pressors for MAP >65 - cont levo, will add neo  Volume  UDS, tylenol, asa, etoh levels pending    RENAL AKI from ATN Hyperkalemia - mild  Lactic  acidosis  P:   F/u chem  Gentle volume as above   GASTROINTESTINAL transaminitis - suspect shock liver   P:   NPO  PPI  F/u LFT's   HEMATOLOGIC Anemia - mild  P:  F/u CBC  INFECTIOUS ?aspiration PNA  P:   Empiric unasyn 9/15>>>  ENDOCRINE Hyperglycemia - no known hx DM    P:   SSI   NEUROLOGIC AMS - post arrest  Unresponsive - anoxic injury  P:   Supportive care  Dismal neurologic prognosis  CT head   OBSTECTRICS:  Reported early pregnancy  P: Urine pregnancy, quant hcg pending  Non-viable ~[redacted]weeks pregnant per family  Consider trans-vag u/s  FAMILY  - Updates:  Mother and boyfriend on way per [employer].  Will  discuss when they arrive      Dirk Dress, NP 06/23/15  3:53 PM Pager: [phone] or [phone]   Attending Note:  I have examined patient, reviewed labs, studies and notes. I have discussed the case with Jasper Riling, and I agree with the data and plans as amended above. Very unfortunate case, 24 yo woman, [redacted] weeks pregnant, brought to [facility] as Cardiac arrest after suspected toxic ingestion. Estimated CPR of 50 minutes, unclear how long she hypoperfused before she was found unresponsive. Her exam is extremely concerning for an anoxic brain injury - pupils are dilated, completely unresponsive on no sedation. Very little chance that the fetus is alive. Not clear to me that it will change immediate management, but I would like to confirm that this a uterine pregnancy with a transvaginal ultrasound. Her chance for meaningful survival is very poor. I will approach her family to discuss her status, possible withdrawal of care.  Independent critical care time is 90 minutes.   Levy Pupa, MD, PhD 06/23/2015, 4:22 PM [facility] [phone] or if no answer [phone]

## 2015-06-12 NOTE — Code Documentation (Signed)
Dr. Lynelle Doctor using ultrasound, has cardiac activity. Femoral pulse present.

## 2015-06-12 NOTE — Progress Notes (Signed)
   07-01-2015 1800  Clinical Encounter Type  Visited With Family;Health care provider  Visit Type Initial;Post-op  Referral From Chaplain   Chaplain's helped connect family to the medical staff on the new unit. Chaplain support available as needed.   Alda Ponder, Chaplain 2015/07/01

## 2015-06-12 NOTE — Progress Notes (Signed)
RT note: Upon pt. arrival, had 7.0 cuffed E.T.tube in place/secured with commercial holder and cuff inflated, Lucas device on/in operation with Capnography being utilized reading high teens/low 20's, noted tube placed with 27cm mark@ top of holder, b/l b.s. originally auscultated by ED physician, pt. rolled multiple times with Defib pad placement, observing Left chest to be rising more than Right,, sats >70's - 80"s, with 100% Fi02/15 lpm connection assured X times, b.s. auscultated with > air movement noted on L/<R, E.T. Tube pulled back to 23cm with >'s = b.s. being noted , CXR taken X for proper placement with 21 cm being placement on R side before transferred to floor.

## 2015-06-12 NOTE — Progress Notes (Signed)
Dr Sherene Sires notified at [facility] of ultrasound results called from radiologist.

## 2015-06-13 ENCOUNTER — Encounter (HOSPITAL_COMMUNITY): Payer: Self-pay | Admitting: Emergency Medicine

## 2015-06-13 ENCOUNTER — Inpatient Hospital Stay (HOSPITAL_COMMUNITY): Payer: Medicaid Other

## 2015-06-13 MED FILL — Medication: Qty: 1 | Status: AC

## 2015-06-13 NOTE — Care Management Note (Signed)
 Case Management Note  Patient Details  Name: Kiara Stanley MRN: [id number] Date of Birth: 04/22/1991  Subjective/Objective:     Adm w cardiac arrest, vent               Action/Plan:lives at home   Expected Discharge Date:                  Expected Discharge Plan:     In-House Referral:     Discharge planning Services     Post Acute Care Choice:    Choice offered to:     DME Arranged:    DME Agency:     HH Arranged:    HH Agency:     Status of Service:     Medicare Important Message Given:    Date Medicare IM Given:    Medicare IM give by:    Date Additional Medicare IM Given:    Additional Medicare Important Message give by:     If discussed at Long Length of Stay Meetings, dates discussed:    Additional Comments: ur review done  Hanley Hays, RN , 7:47 AM

## 2015-06-13 NOTE — Progress Notes (Signed)
Patient asystole on monitor. 2 RN Delice Bison Lubertha Basque) listened- no heart sounds. Ventilator tuned off and no heart or breath sounds heard. Family at bedside. E Link notified

## 2015-06-27 NOTE — Discharge Summary (Signed)
PULMONARY / CRITICAL CARE MEDICINE   Name: Kiara Stanley MRN: [id number] DOB: 1991/08/01    ADMISSION DATE:  28-Jun-2015 DATE OF DEATH: June 29, 2015  FINAL CAUSE OF DEATH:  Severe anoxic encephalopathy  SECONDARY CAUSES OF DEATH Respiratory arrest due to presumed toxic ingestion Acute respiratory failure and ventilator dependence Cardiac arrest with asystole/PEA Probable narcotics overdose, unclear whether accidental or intentional Chronic heroin abuse Possible aspiration pneumonitis versus pneumonia Shock, cardiogenic versus septic Transaminitis consistent with shock liver Acute renal failure, ATN Hyperglycemia  History of Asthma Pregnancy, [redacted] weeks gestation   Hospital course:  23yo female with hx heroin abuse, [redacted] weeks pregnant per family, presented Jun 28, 2023 as asystolic/PEA arrest with total 50 mins CPR before ROSC.  According to her boyfriend she was last seen normal possibly 4 hours prior to admission for she laid down to take a nap. He reports that he was unable to wake her at approximately 1340 and EMS was called. She was given Narcan on their arrival without response. She was asystole on their arrival, was intubated and ventilated restoring PEA. She had intermittent return of spontaneous circulation but this could not be sustained until approximately 50 minutes total CPR. Her boyfriend indicated that he had not observed her using heroin on the day of admission, but her urine drug screen did come back positive for narcotics. Chest x-ray revealed bilateral infiltrates and she was treated for possible aspiration pneumonia. She remained unstable and was started on high-dose pressors with partial but inadequate response. Her neurological exam was consistent with severe anoxic injury and this was confirmed a head CT on 06-28-2023 that showed effacement and evidence for impending tonsillar herniation. Unfortunately despite return of spontaneous circulation this was deemed to be an unrecoverable event due  to the neurological injury. A transvaginal ultrasound was deferred given her critical status and the low likelihood that details regarding the fetus which change her outcome.  Discussions were undertaken with the family and the decision was made to continue her current level of support but to defer any escalation of care. As expected she declined hemodynamically despite mechanical ventilation and pressors. She expired on 06-29-2015 at approximately 4:30 AM.   Levy Pupa, MD, PhD 06/27/2015, 11:16 AM [facility] [phone] or if no answer [phone]

## 2015-06-28 DEATH — deceased

## 2017-05-14 IMAGING — DX DG CHEST 1V PORT
1 series · 1 of 1 positions shown · non-contrast
Comparison: Study obtained earlier in the day

CLINICAL DATA: Hypoxia

EXAM:
PORTABLE CHEST - 1 VIEW

[chest ap]
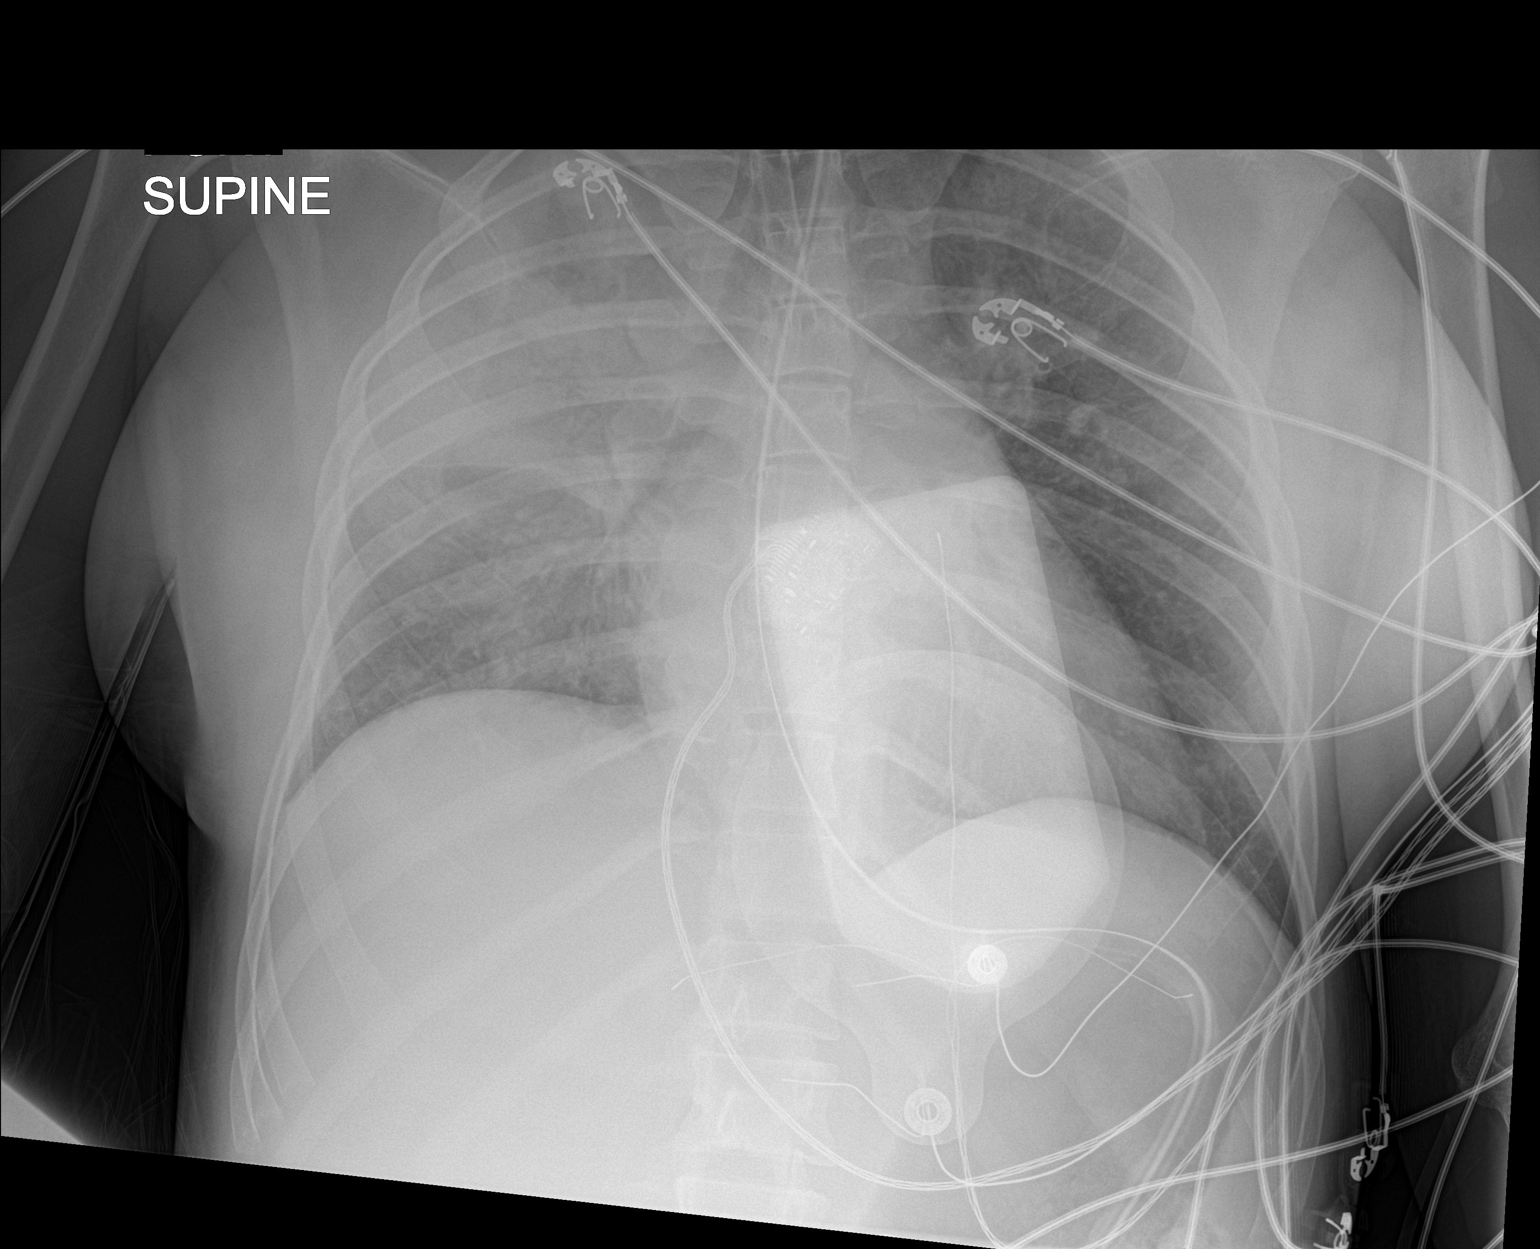

[1 of 1 positions shown; findings below may reference images not displayed]

FINDINGS: Endotracheal tube tip remains in the proximal left main bronchus.
Nasogastric tube tip and side port are in the stomach. There is
persistent consolidation throughout the right upper lobe. Lungs
elsewhere clear. Heart size and pulmonary vascularity are normal. No
pneumothorax. No adenopathy.
IMPRESSION: Endotracheal tube tip remains in the proximal left main bronchus.
Advise withdrawing tube approximately 5 cm. No pneumothorax.
Consolidation right upper lobe. Lungs otherwise clear.

Critical Value/emergent results were called by telephone at the time
of interpretation on 06/12/2015 at [DATE] to Dr. EVOME DRAGIC , who
verbally acknowledged these results.

## 2017-06-11 IMAGING — US US OB TRANSVAGINAL
1 series · 14 of 28 positions shown · non-contrast
Comparison: none

CLINICAL DATA: 23-year-old pregnant female presenting for
evaluation of the intrauterine pregnancy

EXAM:
LIMITED transabdominal OBSTETRIC ULTRASOUND. Transvaginal images
were not performed as the patient was not responsive.

[Series 1: us ob transvaginal · 0.21mm/px · 14 of 28 slices shown]
[im 2/28]
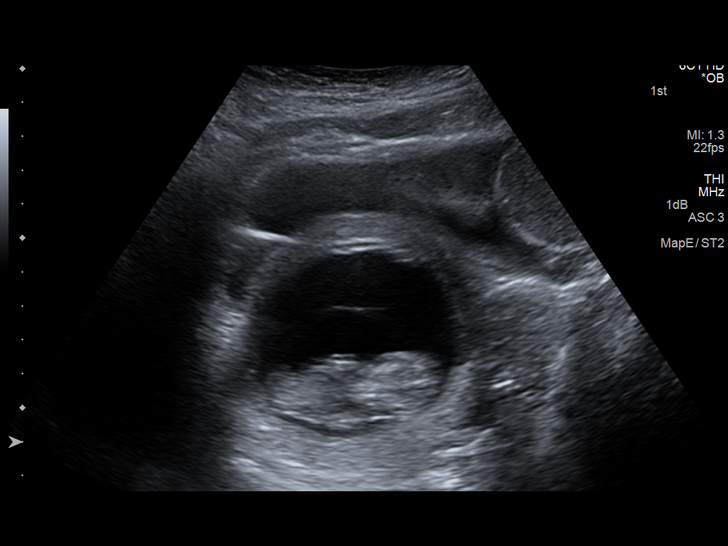
[im 4/28]
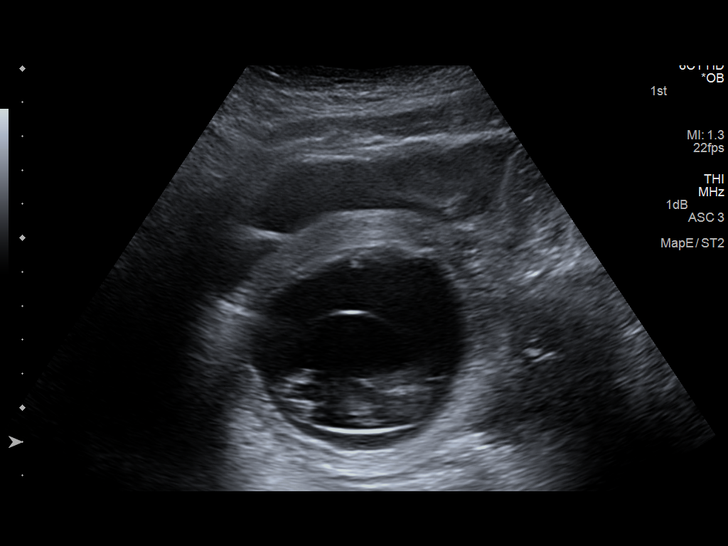
[im 6/28]
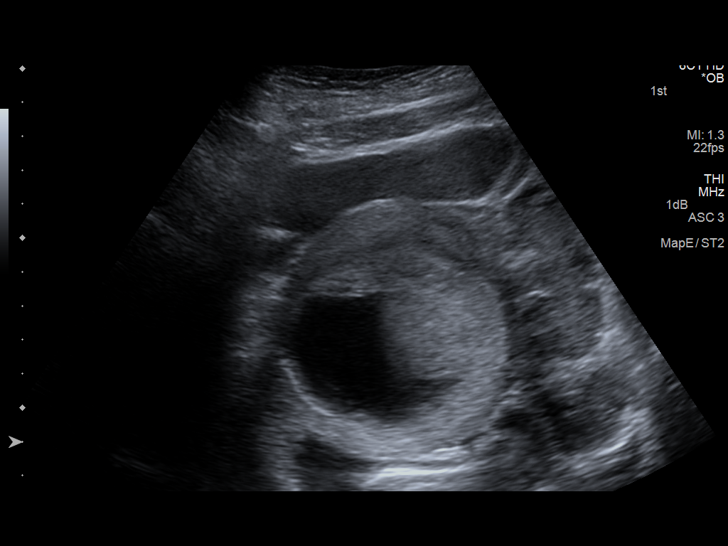
[im 8/28]
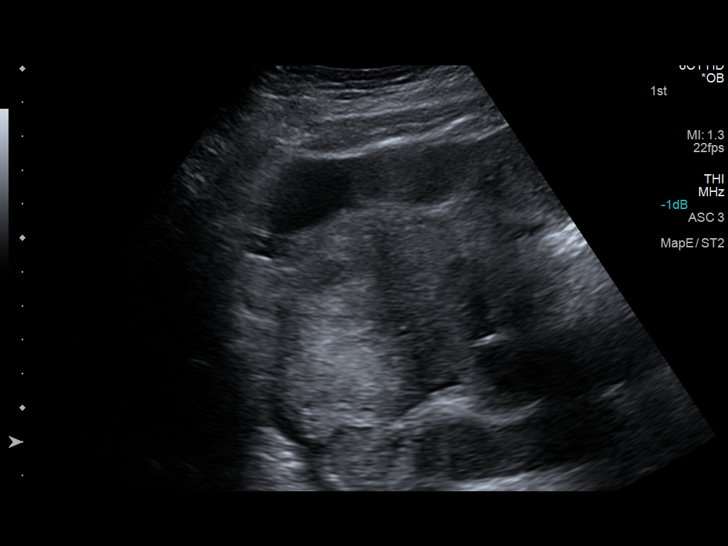
[im 10/28]
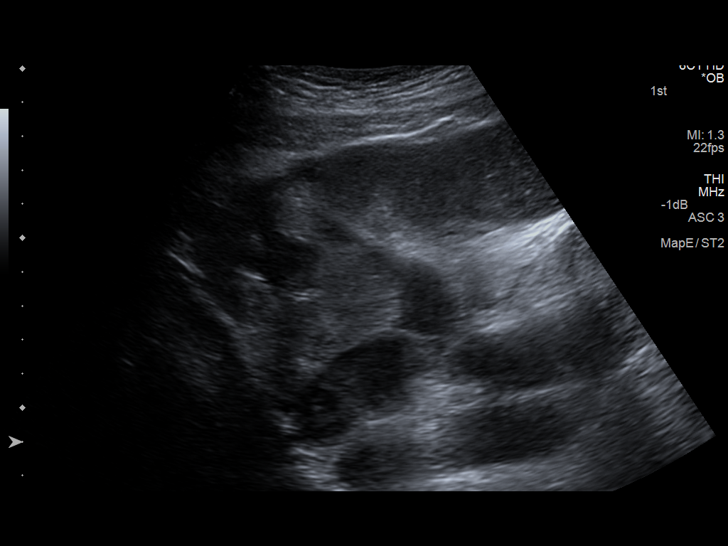
[im 12/28]
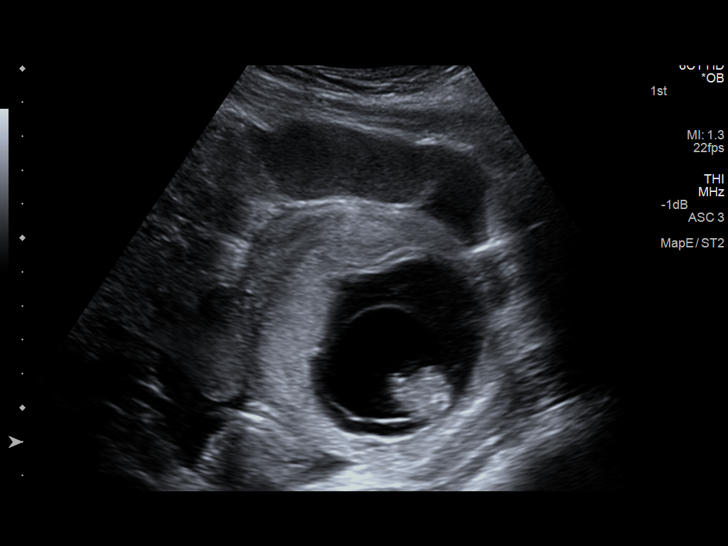
[im 14/28]
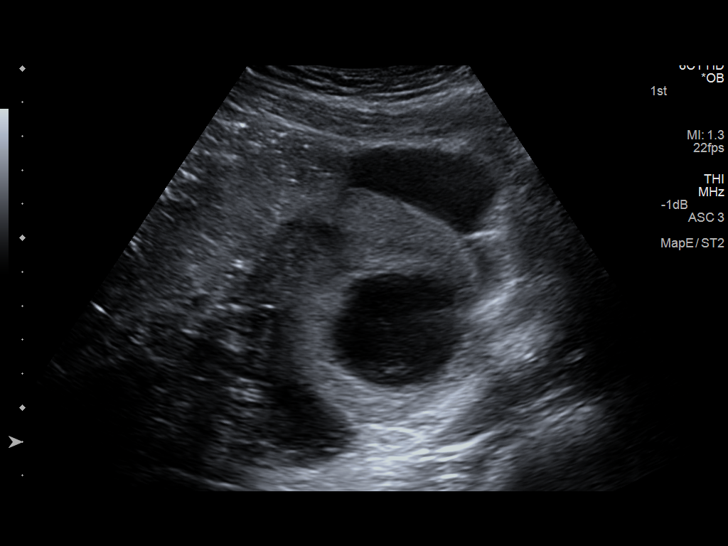
[im 16/28]
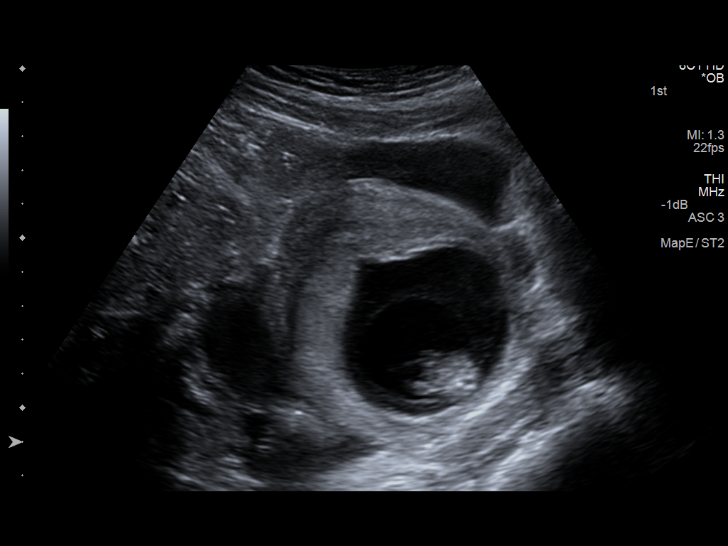
[im 18/28]
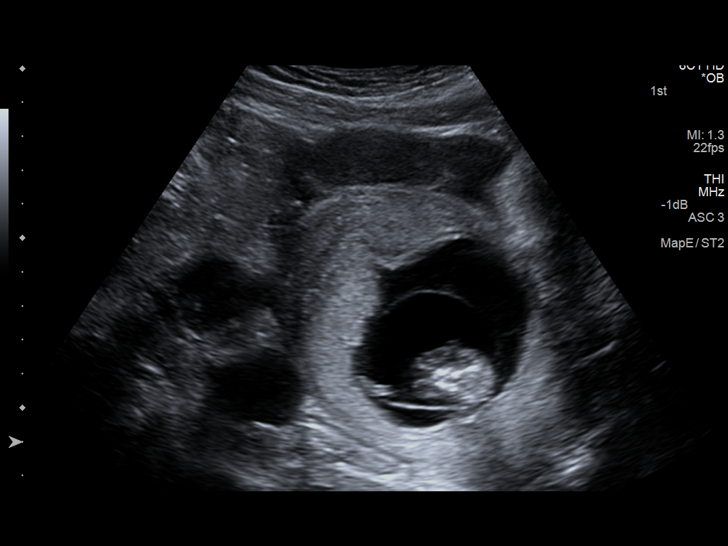
[im 20/28]
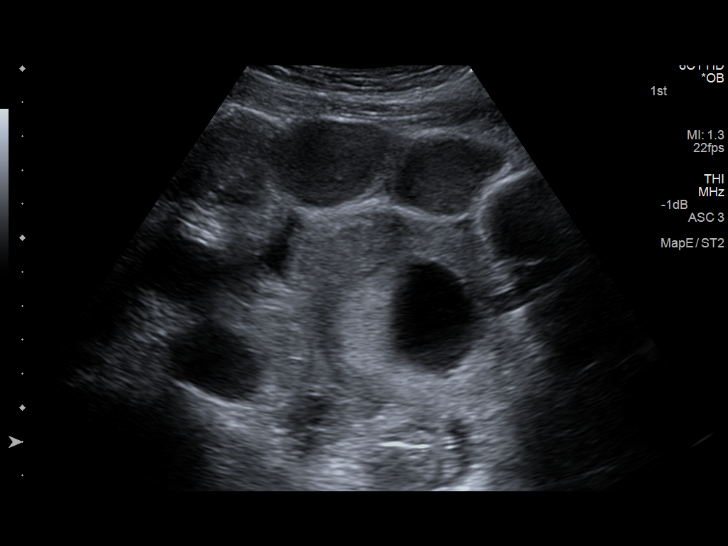
[im 22/28]
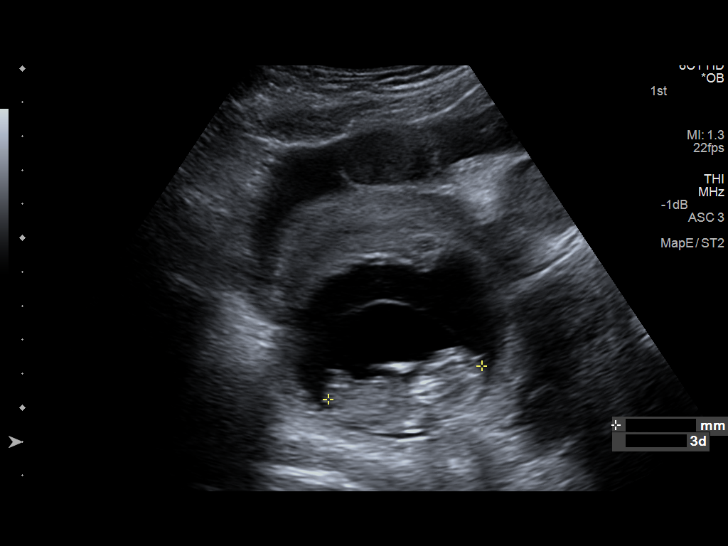
[im 24/28]
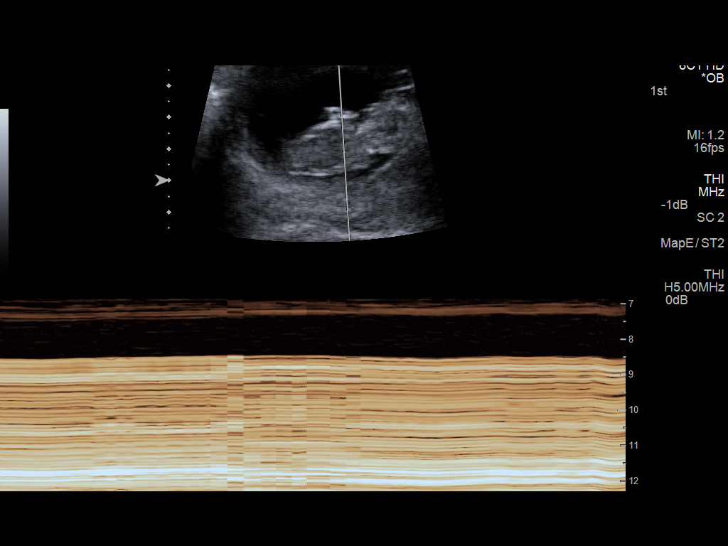
[im 26/28]
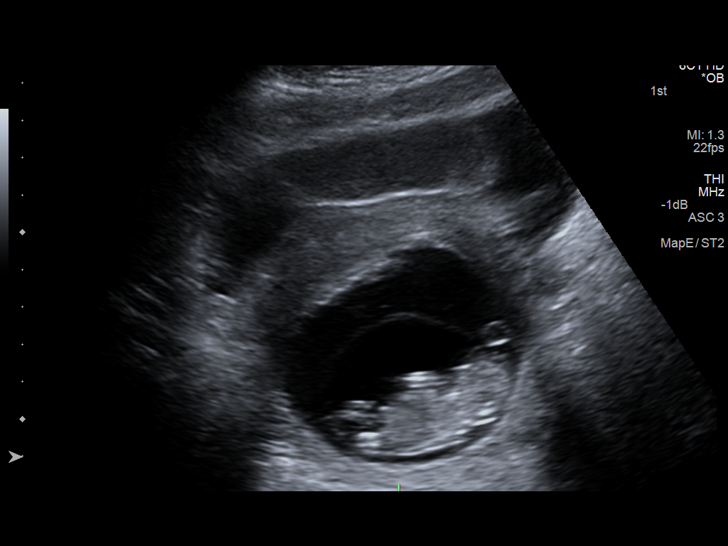
[im 28/28]
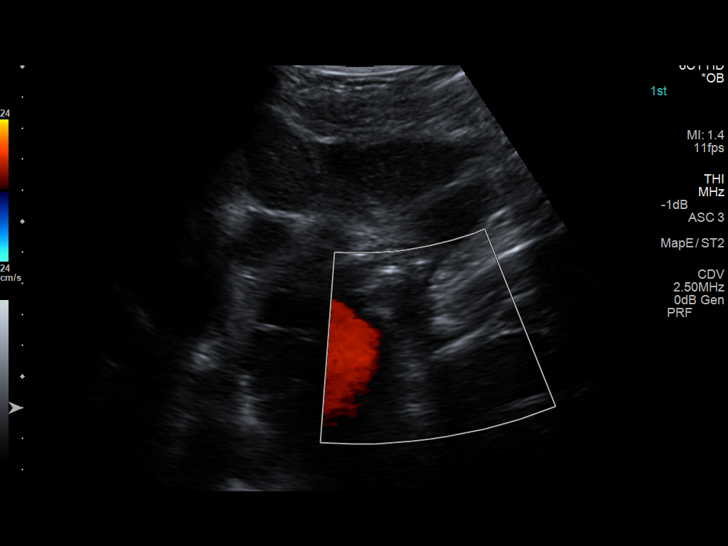

[14 of 28 positions shown; findings below may reference images not displayed]

FINDINGS: There is a single intrauterine gestational sac containing a fetal
pole and a yolk sac. The crown-rump length is 43 mm corresponding to
an estimated gestational age of 11 weeks, 6 days with an EDC of
12/27/2015. No cardiac activity identified.

The maternal ovaries are not visualized.
IMPRESSION: Single intrauterine pregnancy with an estimated gestational age of
11 weeks, 6 days. No cardiac activity identified.

These results were called by telephone at the time of interpretation
on 06/12/2015 at [DATE] to nurse Gansharo, who verbally
acknowledged these results.

This exam is performed on an emergent basis and does not
comprehensively evaluate fetal size, dating, or anatomy; follow-up
complete OB US should be considered if further fetal assessment is
warranted.
